# Patient Record
Sex: Male | Born: 1965 | Race: White | Hispanic: No | Marital: Married | Smoking: Never smoker
Health system: Southern US, Community
[De-identification: ages and names within clinical notes are randomized; demographics above are authoritative.]

## PROBLEM LIST (undated history)

## (undated) DIAGNOSIS — J302 Other seasonal allergic rhinitis: Secondary | ICD-10-CM

## (undated) HISTORY — PX: LUMBAR FUSION: SHX111

---

## 2002-11-09 ENCOUNTER — Emergency Department (HOSPITAL_COMMUNITY): Admission: EM | Admit: 2002-11-09 | Discharge: 2002-11-09 | Payer: Self-pay | Admitting: Emergency Medicine

## 2013-06-06 ENCOUNTER — Emergency Department (INDEPENDENT_AMBULATORY_CARE_PROVIDER_SITE_OTHER)
Admission: EM | Admit: 2013-06-06 | Discharge: 2013-06-06 | Disposition: A | Discharge status: Home / Self Care | Payer: BC Managed Care – PPO | Source: Home / Self Care | Attending: Family Medicine | Admitting: Family Medicine

## 2013-06-06 ENCOUNTER — Emergency Department (INDEPENDENT_AMBULATORY_CARE_PROVIDER_SITE_OTHER): Payer: BC Managed Care – PPO

## 2013-06-06 ENCOUNTER — Encounter: Payer: Self-pay | Admitting: Emergency Medicine

## 2013-06-06 DIAGNOSIS — J069 Acute upper respiratory infection, unspecified: Secondary | ICD-10-CM

## 2013-06-06 DIAGNOSIS — J302 Other seasonal allergic rhinitis: Secondary | ICD-10-CM | POA: Insufficient documentation

## 2013-06-06 DIAGNOSIS — R509 Fever, unspecified: Secondary | ICD-10-CM

## 2013-06-06 DIAGNOSIS — J3489 Other specified disorders of nose and nasal sinuses: Secondary | ICD-10-CM

## 2013-06-06 DIAGNOSIS — J029 Acute pharyngitis, unspecified: Secondary | ICD-10-CM

## 2013-06-06 HISTORY — DX: Other seasonal allergic rhinitis: J30.2

## 2013-06-06 LAB — POCT CBC W AUTO DIFF (K'VILLE URGENT CARE)

## 2013-06-06 LAB — POCT RAPID STREP A (OFFICE): RAPID STREP A SCREEN: NEGATIVE

## 2013-06-06 MED ORDER — BENZONATATE 200 MG PO CAPS: 200.0000 mg | ORAL_CAPSULE | Freq: Every day | ORAL | Status: AC

## 2013-06-06 MED ORDER — PREDNISONE 20 MG PO TABS: 20.0000 mg | ORAL_TABLET | Freq: Two times a day (BID) | ORAL | Status: AC

## 2013-06-06 NOTE — ED Notes (Signed)
Travis CanalesSteve complains of fevers, chills, sweats, headaches and sore throat for 3-4 days. He did have an E-Visit with his PCP, Rosalio MacadamiaSteven Helman, MD, on Wednesday. He was prescribed Augmentin 875-125 mg 1 bid. He still reports symptoms. He is taking Advil for the fevers with relief.

## 2013-06-06 NOTE — ED Provider Notes (Addendum)
CSN: [id number]     Arrival date & time 06/06/13  [id number] History   First MD Initiated Contact with Patient 06/06/13 1001     Chief Complaint  Patient presents with  . Generalized Body Aches    x 3 days  . Sore Throat    x 3 days      HPI Comments: Patient developed a headache and sore throat about 4 days ago.  The next day he developed sinus congestion, fever to 100+, chills/sweats, and myalgias.  He had an e-visit with his PCP who started him on Augmentin 875/125.  He has now developed a cough, worse at night, and has not improved.  He continues to have chills/sweats and fatigue. He has a history of seasonal allergies and sinusitis.  The history is provided by the patient.    Past Medical History  Diagnosis Date  . Seasonal allergies    Past Surgical History  Procedure Laterality Date  . Lumbar fusion     Family History  Problem Relation Age of Onset  . Hypertension Mother   . Hypertension Father    History  Substance Use Topics  . Smoking status: Never Smoker   . Smokeless tobacco: Never Used  . Alcohol Use: Yes    Review of Systems + sore throat + cough No pleuritic pain No wheezing + nasal congestion + post-nasal drainage No sinus pain/pressure No itchy/red eyes No earache No hemoptysis No SOB + fever, + chills No nausea No vomiting No abdominal pain No diarrhea No urinary symptoms No skin rash + fatigue + myalgias + headache Used OTC meds without relief  Allergies  Avelox  Home Medications   Current Outpatient Rx  Name  Route  Sig  Dispense  Refill  . ibuprofen (ADVIL,MOTRIN) 200 MG tablet   Oral   Take 200 mg by mouth every 6 (six) hours as needed.         Marland Kitchen. levocetirizine (XYZAL) 5 MG tablet   Oral   Take 5 mg by mouth every evening.         . saw palmetto 160 MG capsule   Oral   Take 160 mg by mouth 2 (two) times daily.         . benzonatate (TESSALON) 200 MG capsule   Oral   Take 1 capsule (200 mg total) by mouth at bedtime.  Take as needed for cough   12 capsule   0   . predniSONE (DELTASONE) 20 MG tablet   Oral   Take 1 tablet (20 mg total) by mouth 2 (two) times daily. Take with food.   10 tablet   0    BP 126/87  Pulse 96  Temp(Src) 98.6 F (37 C) (Oral)  Resp 16  Ht 6\' 4"  (1.93 m)  Wt 212 lb (96.163 kg)  BMI 25.82 kg/m2  SpO2 97% Physical Exam Nursing notes and Vital Signs reviewed. Appearance:  Patient appears healthy, stated age, and in no acute distress Eyes:  Pupils are equal, round, and reactive to light and accomodation.  Extraocular movement is intact.  Conjunctivae are not inflamed  Ears:   Canals and tympanic membranes normal Nose:  Mildly congested turbinates.  No sinus tenderness.   Pharynx:   Minimal erythema Neck:  Supple.  Tender shotty anterior nodes; enlarged but non-tender posterior nodes Lungs:  Clear to auscultation.  Breath sounds are equal.  Heart:  Regular rate and rhythm without murmurs, rubs, or gallops.  Abdomen:  Nontender without masses or  hepatosplenomegaly.  Bowel sounds are present.  No CVA or flank tenderness.  Extremities:  No edema.  No calf tenderness Skin:  No rash present.   ED Course  Procedures  none    Labs Reviewed  POCT CBC W AUTO DIFF ([facility]):  WBC 7.7; LY 14.3; MO 9.8; GR 75.9; Hgb 17.0; Platelets 167    POCT RAPID STREP A (OFFICE) - Negative  STREP A DNA PROBE   Imaging Review Dg Sinuses Complete  06/06/2013   CLINICAL DATA:  Fever, congestion.  EXAM: PARANASAL SINUSES - COMPLETE 3 + VIEW  COMPARISON:  None.  FINDINGS: The paranasal sinus are aerated. There is no evidence of sinus opacification air-fluid levels or mucosal thickening. No significant bone abnormalities are seen.  IMPRESSION: No significant sinus opacification or air-fluid levels identified.   Electronically Signed   By: Annia Belt M.D.   On: 06/06/2013 10:49     MDM   1. Sore throat   2. Acute upper respiratory infections of unspecified site; suspect viral URI.   Normal white blood count and sinus films reassuring.    Throat culture pending. Begin prednisone burst.  Prescription written for Benzonatate (Tessalon) to take at bedtime for night-time cough.  Take plain Mucinex (1200 mg guaifenesin) twice daily for cough and congestion.  May add Sudafed for sinus congestion.   Increase fluid intake, rest. May use Afrin nasal spray (or generic oxymetazoline) twice daily for about 5 days.  Also recommend using saline nasal spray several times daily and saline nasal irrigation (AYR is a common brand).  Use Flonase spray each morning after using Afrin spray and saline rinse. Continue and finish Augmentin. Try warm salt water gargles for sore throat.  Stop all antihistamines for now, and other non-prescription cough/cold preparations. Follow-up with family doctor if not improving 7 to 10 days.     Lattie Haw, MD 06/08/13 1610  Lattie Haw, MD 06/08/13 [id number]

## 2013-06-06 NOTE — Discharge Instructions (Signed)
Take plain Mucinex (1200 mg guaifenesin) twice daily for cough and congestion.  May add Sudafed for sinus congestion.   Increase fluid intake, rest. May use Afrin nasal spray (or generic oxymetazoline) twice daily for about 5 days.  Also recommend using saline nasal spray several times daily and saline nasal irrigation (AYR is a common brand).  Use Flonase spray each morning after using Afrin spray and saline rinse. Continue and finish Augmentin. Try warm salt water gargles for sore throat.  Stop all antihistamines for now, and other non-prescription cough/cold preparations. Follow-up with family doctor if not improving 7 to 10 days.

## 2013-06-07 LAB — STREP A DNA PROBE: GASP: NEGATIVE

## 2013-06-10 ENCOUNTER — Telehealth: Payer: Self-pay | Admitting: *Deleted

## 2015-04-14 IMAGING — CR DG SINUSES COMPLETE 3+V
4 series · 4 of 4 positions shown · non-contrast
Comparison: None.

CLINICAL DATA: Fever, congestion.

EXAM:
PARANASAL SINUSES - COMPLETE 3 + VIEW

[view not recorded (1 of 4)]
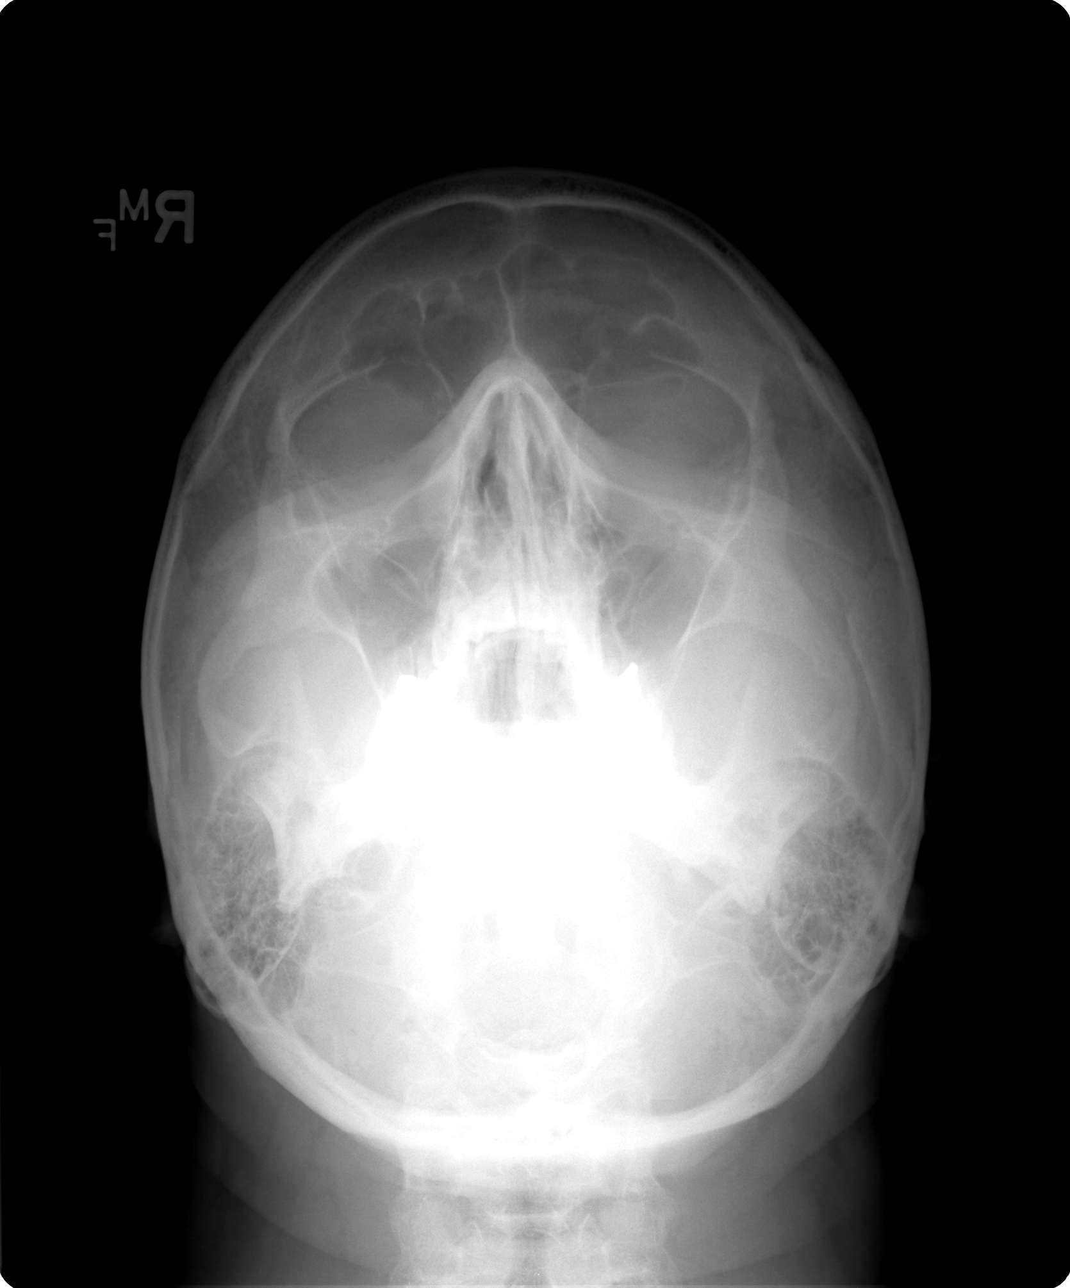

[view not recorded (2 of 4)]
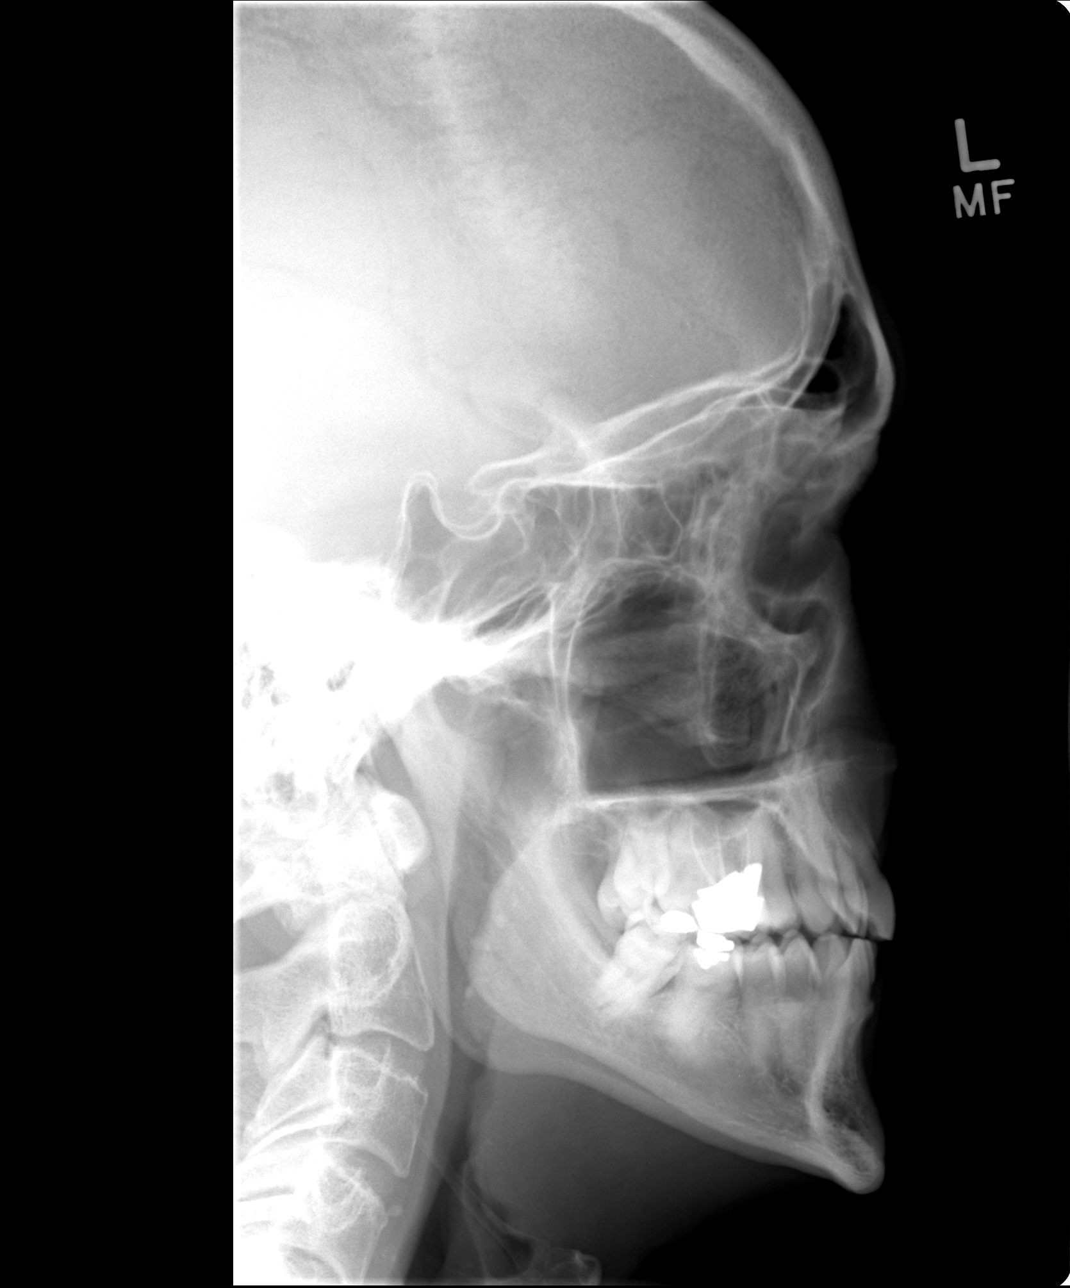

[view not recorded (3 of 4)]
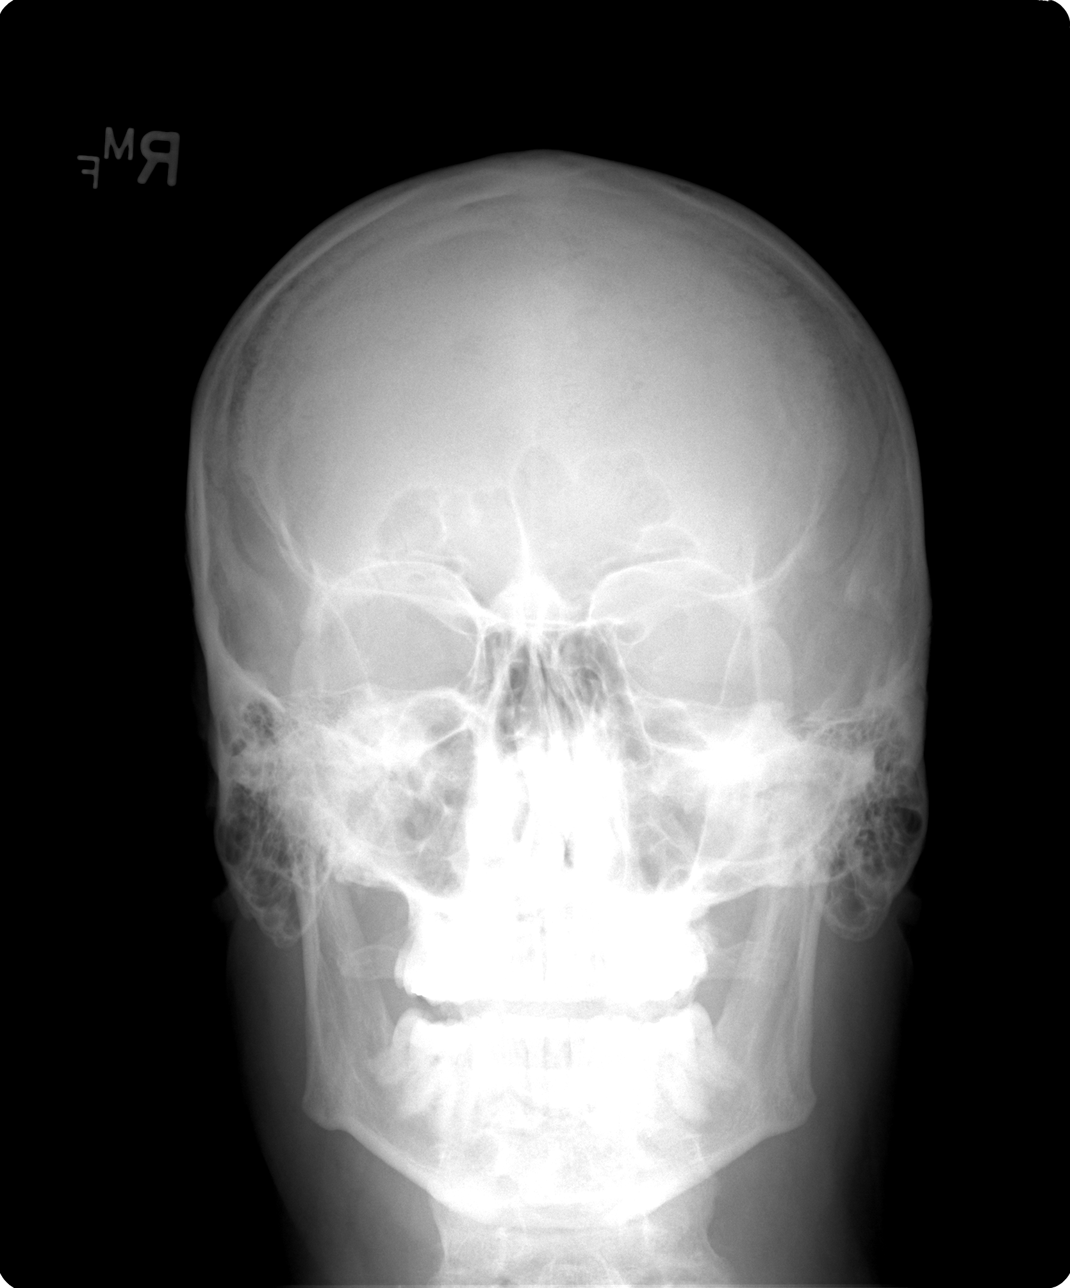

[view not recorded (4 of 4)]
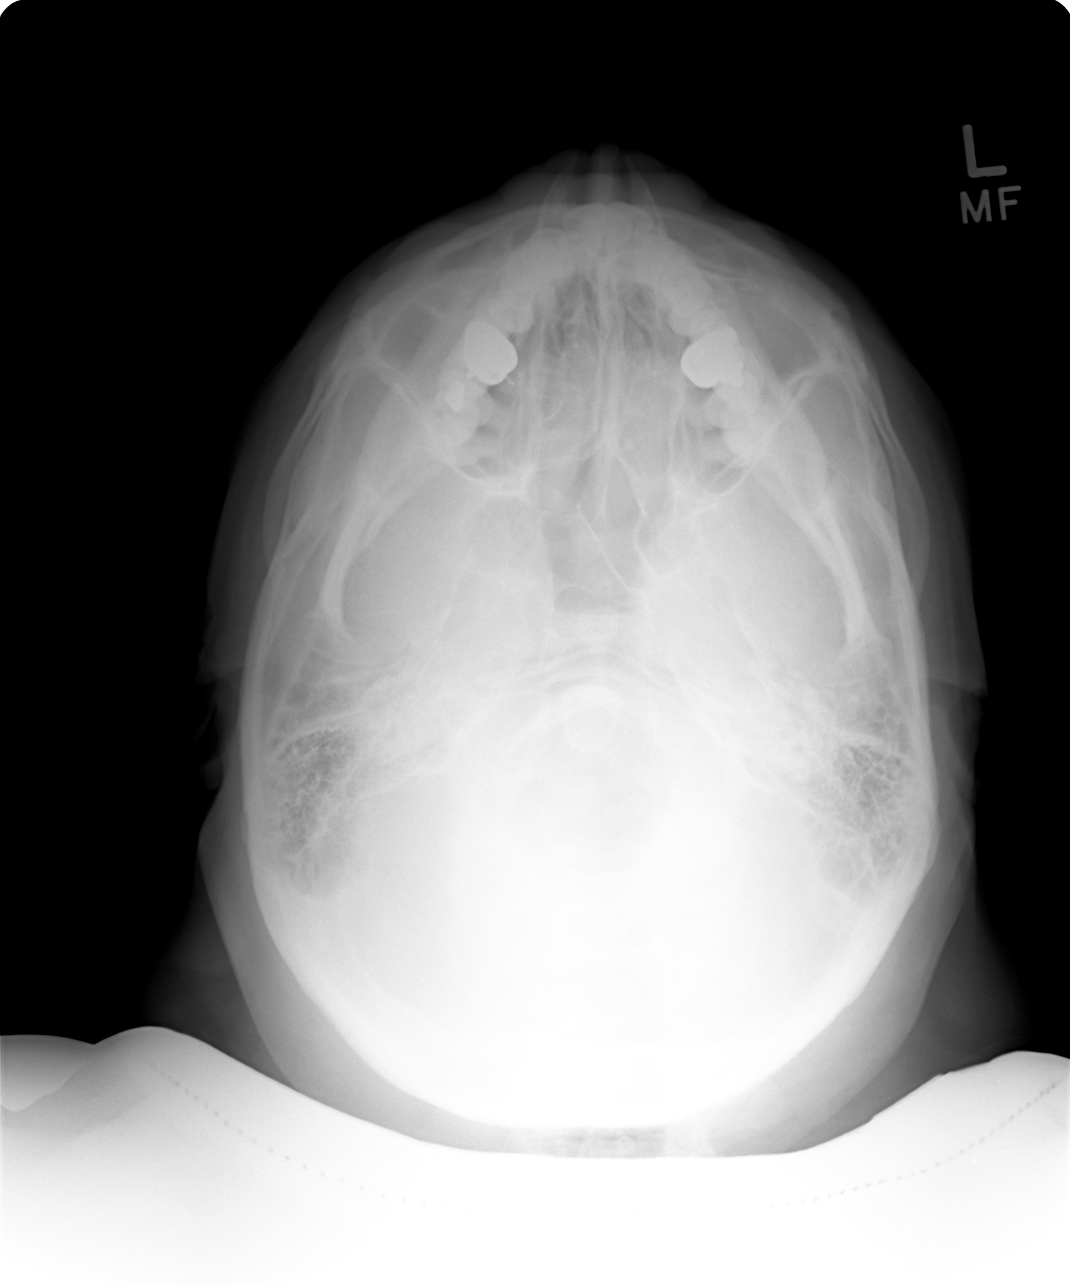

[4 of 4 positions shown; findings below may reference images not displayed]

FINDINGS: The paranasal sinus are aerated. There is no evidence of sinus
opacification air-fluid levels or mucosal thickening. No significant
bone abnormalities are seen.
IMPRESSION: No significant sinus opacification or air-fluid levels identified.

## 2020-11-19 ENCOUNTER — Emergency Department (HOSPITAL_BASED_OUTPATIENT_CLINIC_OR_DEPARTMENT_OTHER)
Admission: EM | Admit: 2020-11-19 | Discharge: 2020-11-19 | Disposition: A | Discharge status: Home / Self Care | Payer: BC Managed Care – PPO | Attending: Emergency Medicine | Admitting: Emergency Medicine

## 2020-11-19 ENCOUNTER — Emergency Department (HOSPITAL_BASED_OUTPATIENT_CLINIC_OR_DEPARTMENT_OTHER): Payer: BC Managed Care – PPO

## 2020-11-19 ENCOUNTER — Other Ambulatory Visit: Payer: Self-pay

## 2020-11-19 ENCOUNTER — Encounter (HOSPITAL_BASED_OUTPATIENT_CLINIC_OR_DEPARTMENT_OTHER): Payer: Self-pay | Admitting: Urology

## 2020-11-19 DIAGNOSIS — R1011 Right upper quadrant pain: Secondary | ICD-10-CM | POA: Diagnosis present

## 2020-11-19 DIAGNOSIS — K8020 Calculus of gallbladder without cholecystitis without obstruction: Secondary | ICD-10-CM | POA: Insufficient documentation

## 2020-11-19 LAB — CBC WITH DIFFERENTIAL/PLATELET
Abs Immature Granulocytes: 0.04 10*3/uL (ref 0.00–0.07)
Basophils Absolute: 0.1 10*3/uL (ref 0.0–0.1)
Basophils Relative: 1 %
Eosinophils Absolute: 0.3 10*3/uL (ref 0.0–0.5)
Eosinophils Relative: 3 %
HCT: 44.5 % (ref 39.0–52.0)
Hemoglobin: 16.3 g/dL (ref 13.0–17.0)
Immature Granulocytes: 0 %
Lymphocytes Relative: 15 %
Lymphs Abs: 1.6 10*3/uL (ref 0.7–4.0)
MCH: 32 pg (ref 26.0–34.0)
MCHC: 36.6 g/dL — ABNORMAL HIGH (ref 30.0–36.0)
MCV: 87.3 fL (ref 80.0–100.0)
Monocytes Absolute: 0.9 10*3/uL (ref 0.1–1.0)
Monocytes Relative: 9 %
Neutro Abs: 7.4 10*3/uL (ref 1.7–7.7)
Neutrophils Relative %: 72 %
Platelets: 193 10*3/uL (ref 150–400)
RBC: 5.1 MIL/uL (ref 4.22–5.81)
RDW: 11.7 % (ref 11.5–15.5)
WBC: 10.3 10*3/uL (ref 4.0–10.5)
nRBC: 0 % (ref 0.0–0.2)

## 2020-11-19 LAB — COMPREHENSIVE METABOLIC PANEL
ALT: 34 U/L (ref 0–44)
AST: 24 U/L (ref 15–41)
Albumin: 4.3 g/dL (ref 3.5–5.0)
Alkaline Phosphatase: 58 U/L (ref 38–126)
Anion gap: 8 (ref 5–15)
BUN: 15 mg/dL (ref 6–20)
CO2: 25 mmol/L (ref 22–32)
Calcium: 9.3 mg/dL (ref 8.9–10.3)
Chloride: 99 mmol/L (ref 98–111)
Creatinine, Ser: 1.07 mg/dL (ref 0.61–1.24)
GFR, Estimated: >60 mL/min (ref >60)
Glucose, Bld: 106 mg/dL — ABNORMAL HIGH (ref 70–99)
Potassium: 3.4 mmol/L — ABNORMAL LOW (ref 3.5–5.1)
Sodium: 132 mmol/L — ABNORMAL LOW (ref 135–145)
Total Bilirubin: 2.1 mg/dL — ABNORMAL HIGH (ref 0.3–1.2)
Total Protein: 7.2 g/dL (ref 6.5–8.1)

## 2020-11-19 LAB — LIPASE, BLOOD: Lipase: 30 U/L (ref 11–51)

## 2020-11-19 MED ORDER — HYDROCODONE-ACETAMINOPHEN 5-325 MG PO TABS: 1.0000 | ORAL_TABLET | ORAL | 0 refills | Status: AC | PRN

## 2020-11-19 MED ORDER — HYDROCODONE-ACETAMINOPHEN 5-325 MG PO TABS: 1.0000 | ORAL_TABLET | ORAL | 0 refills | Status: DC | PRN

## 2020-11-19 MED ORDER — ESOMEPRAZOLE MAGNESIUM 20 MG PO CPDR: 20.0000 mg | DELAYED_RELEASE_CAPSULE | Freq: Every day | ORAL | 1 refills | Status: DC

## 2020-11-19 NOTE — ED Notes (Signed)
Patient transported to Ultrasound 

## 2020-11-19 NOTE — ED Notes (Signed)
Pt A&OX4 ambulatory at d/c with independent steady gait, NAD.  

## 2020-11-19 NOTE — ED Triage Notes (Signed)
RUQ pain since Wednesday.  States h/o gallstones.  Reports hight fat diet this past weekend.  Low grade fever of 99.5 at home.

## 2020-11-19 NOTE — Discharge Instructions (Addendum)
Return if any problems. Schedule to see the Surgeon for evaluation

## 2020-11-20 NOTE — ED Provider Notes (Signed)
[facility] EMERGENCY DEPARTMENT Provider Note   CSN: [id number] Arrival date & time: 11/19/20  1856     History Chief Complaint  Patient presents with   Abdominal Pain    Travis Wu is a 55 y.o. male.  Pt complains of right sided abdominal pain.  Pt reports he has been diagnosed with a gallstone in the past.  He sees Gi in [place].  Pt reports he is taking nexium.   The history is provided by the patient. No language interpreter was used.  Abdominal Pain Pain location:  RUQ Pain quality: aching   Pain radiates to:  Does not radiate Pain severity:  Moderate Onset quality:  Gradual Timing:  Constant Progression:  Worsening Chronicity:  New Relieved by:  Nothing Worsened by:  Nothing Ineffective treatments:  None tried Associated symptoms: nausea   Associated symptoms: no vomiting   Risk factors: has not had multiple surgeries       Past Medical History:  Diagnosis Date   Seasonal allergies     Patient Active Problem List   Diagnosis Date Noted   Seasonal allergies     Past Surgical History:  Procedure Laterality Date   LUMBAR FUSION         Family History  Problem Relation Age of Onset   Hypertension Mother    Hypertension Father     Social History   Tobacco Use   Smoking status: Never   Smokeless tobacco: Never  Substance Use Topics   Alcohol use: Yes   Drug use: No    Home Medications Prior to Admission medications   Medication Sig Start Date End Date Taking? Authorizing Provider  HYDROcodone-acetaminophen (NORCO/VICODIN) 5-325 MG tablet Take 1 tablet by mouth every 4 (four) hours as needed for moderate pain. 11/19/20 11/19/21 Yes Cheron Schaumann K, PA-C  benzonatate (TESSALON) 200 MG capsule Take 1 capsule (200 mg total) by mouth at bedtime. Take as needed for cough 06/06/13   Lattie Haw, MD  ibuprofen (ADVIL,MOTRIN) 200 MG tablet Take 200 mg by mouth every 6 (six) hours as needed.    [provider]  levocetirizine  (XYZAL) 5 MG tablet Take 5 mg by mouth every evening.    [provider]  predniSONE (DELTASONE) 20 MG tablet Take 1 tablet (20 mg total) by mouth 2 (two) times daily. Take with food. 06/06/13   Lattie Haw, MD  saw palmetto 160 MG capsule Take 160 mg by mouth 2 (two) times daily.    [provider]    Allergies    Avelox [moxifloxacin hcl in nacl]  Review of Systems   Review of Systems  Gastrointestinal:  Positive for abdominal pain and nausea. Negative for vomiting.  All other systems reviewed and are negative.  Physical Exam Updated Vital Signs BP (!) 162/102 (BP Location: Left Arm)   Pulse 72   Temp 98.3 F (36.8 C) (Oral)   Resp 17   Ht 6\' 4"  (1.93 m)   Wt 97.5 kg   SpO2 97%   BMI 26.17 kg/m   Physical Exam Vitals and nursing note reviewed.  Constitutional:      Appearance: He is well-developed.  HENT:     Head: Normocephalic.  Cardiovascular:     Rate and Rhythm: Normal rate.     Heart sounds: Normal heart sounds.  Pulmonary:     Effort: Pulmonary effort is normal.  Abdominal:     General: Abdomen is flat. Bowel sounds are normal. There is no distension.  Palpations: Abdomen is soft.     Tenderness: There is abdominal tenderness in the right upper quadrant.     Hernia: No hernia is present.  Musculoskeletal:        General: Normal range of motion.     Cervical back: Normal range of motion.  Neurological:     General: No focal deficit present.     Mental Status: He is alert and oriented to person, place, and time.  Psychiatric:        Mood and Affect: Mood normal.    ED Results / Procedures / Treatments   Labs (all labs ordered are listed, but only abnormal results are displayed) Labs Reviewed  CBC WITH DIFFERENTIAL/PLATELET - Abnormal; Notable for the following components:      Result Value   MCHC 36.6 (*)    All other components within normal limits  COMPREHENSIVE METABOLIC PANEL - Abnormal; Notable for the following  components:   Sodium 132 (*)    Potassium 3.4 (*)    Glucose, Bld 106 (*)    Total Bilirubin 2.1 (*)    All other components within normal limits  LIPASE, BLOOD    EKG None  Radiology US Abdomen Limited RUQ (LIVER/GB)  Result Date: 11/19/2020 CLINICAL DATA:  Right upper quadrant abdominal pain for the past 3 days. History of cholelithiasis or gallbladder polyp seen elsewhere. EXAM: ULTRASOUND ABDOMEN LIMITED RIGHT UPPER QUADRANT COMPARISON:  None. FINDINGS: Gallbladder: 9 mm mobile echogenic gallstone in the gallbladder. No gallbladder wall thickening or pericholecystic fluid. No sonographic Murphy sign. Common bile duct: Diameter: 2.3 mm Liver: No focal lesion identified. Within normal limits in parenchymal echogenicity. Portal vein is patent on color Doppler imaging with normal direction of blood flow towards the liver. Other: None. IMPRESSION: Cholelithiasis without evidence of cholecystitis. Electronically Signed   By: Beckie Salts M.D.   On: 11/19/2020 20:49    Procedures Procedures   Medications Ordered in ED Medications - No data to display  ED Course  I have reviewed the triage vital signs and the nursing notes.  Pertinent labs & imaging results that were available during my care of the patient were reviewed by me and considered in my medical decision making (see chart for details).    MDM Rules/Calculators/A&P                           MDM:  labs and ultrasound ordered results reviewed and interpreted.  Pt counsled on gallstone.  I suspect pt had a gallbladder attack.  Pt counseled on diet modification.  Pt given rx for pain.  He is advised to schedule follow up with surgery  Final Clinical Impression(s) / ED Diagnoses Final diagnoses:  RUQ pain  Gallstones    Rx / DC Orders ED Discharge Orders          Ordered    esomeprazole (NEXIUM) 20 MG capsule  Daily,   Status:  Discontinued        11/19/20 2210    HYDROcodone-acetaminophen (NORCO/VICODIN) 5-325 MG tablet   Every 4 hours PRN,   Status:  Discontinued        11/19/20 2210    HYDROcodone-acetaminophen (NORCO/VICODIN) 5-325 MG tablet  Every 4 hours PRN        11/19/20 2218          An After Visit Summary was printed and given to the patient.    Elson Areas, [place] 11/20/20 1825  Alvira Monday, MD 11/21/20 1300
